# Patient Record
Sex: Female | Born: 1960 | Race: Black or African American | Hispanic: No | Marital: Single | Smoking: Former smoker
Health system: Southern US, Community
[De-identification: ages and names within clinical notes are randomized; demographics above are authoritative.]

## PROBLEM LIST (undated history)

## (undated) DIAGNOSIS — C801 Malignant (primary) neoplasm, unspecified: Secondary | ICD-10-CM

## (undated) DIAGNOSIS — I10 Essential (primary) hypertension: Secondary | ICD-10-CM

## (undated) HISTORY — PX: CHOLECYSTECTOMY: SHX55

## (undated) HISTORY — PX: BREAST SURGERY: SHX581

---

## 2016-04-20 ENCOUNTER — Emergency Department (HOSPITAL_COMMUNITY): Payer: Self-pay

## 2016-04-20 ENCOUNTER — Emergency Department (HOSPITAL_COMMUNITY)
Admission: EM | Admit: 2016-04-20 | Discharge: 2016-04-20 | Disposition: A | Discharge status: Home / Self Care | Payer: Self-pay | Attending: Emergency Medicine | Admitting: Emergency Medicine

## 2016-04-20 ENCOUNTER — Encounter (HOSPITAL_COMMUNITY): Payer: Self-pay | Admitting: *Deleted

## 2016-04-20 DIAGNOSIS — Z87891 Personal history of nicotine dependence: Secondary | ICD-10-CM | POA: Insufficient documentation

## 2016-04-20 DIAGNOSIS — R0789 Other chest pain: Secondary | ICD-10-CM | POA: Insufficient documentation

## 2016-04-20 DIAGNOSIS — I10 Essential (primary) hypertension: Secondary | ICD-10-CM | POA: Insufficient documentation

## 2016-04-20 DIAGNOSIS — R079 Chest pain, unspecified: Secondary | ICD-10-CM

## 2016-04-20 DIAGNOSIS — F419 Anxiety disorder, unspecified: Secondary | ICD-10-CM | POA: Insufficient documentation

## 2016-04-20 DIAGNOSIS — Z853 Personal history of malignant neoplasm of breast: Secondary | ICD-10-CM | POA: Insufficient documentation

## 2016-04-20 HISTORY — DX: Essential (primary) hypertension: I10

## 2016-04-20 HISTORY — DX: Malignant (primary) neoplasm, unspecified: C80.1

## 2016-04-20 LAB — BASIC METABOLIC PANEL
Anion gap: 8 (ref 5–15)
BUN: 12 mg/dL (ref 6–20)
CHLORIDE: 109 mmol/L (ref 101–111)
CO2: 22 mmol/L (ref 22–32)
CREATININE: 0.66 mg/dL (ref 0.44–1.00)
Calcium: 8.3 mg/dL — ABNORMAL LOW (ref 8.9–10.3)
GFR calc non Af Amer: >60 mL/min (ref >60)
GLUCOSE: 112 mg/dL — AB (ref 65–99)
Potassium: 3.9 mmol/L (ref 3.5–5.1)
Sodium: 139 mmol/L (ref 135–145)

## 2016-04-20 LAB — CBC
HCT: 40.5 % (ref 36.0–46.0)
Hemoglobin: 13.7 g/dL (ref 12.0–15.0)
MCH: 31.9 pg (ref 26.0–34.0)
MCHC: 33.8 g/dL (ref 30.0–36.0)
MCV: 94.2 fL (ref 78.0–100.0)
PLATELETS: 180 10*3/uL (ref 150–400)
RBC: 4.3 MIL/uL (ref 3.87–5.11)
RDW: 14.6 % (ref 11.5–15.5)
WBC: 6.9 10*3/uL (ref 4.0–10.5)

## 2016-04-20 LAB — I-STAT BETA HCG BLOOD, ED (MC, WL, AP ONLY): I-stat hCG, quantitative: <5 m[IU]/mL (ref <5)

## 2016-04-20 LAB — I-STAT TROPONIN, ED: Troponin i, poc: 0 ng/mL (ref 0.00–0.08)

## 2016-04-20 NOTE — ED Triage Notes (Signed)
PT states R chest pain and sob since last night.  States her cousin (who is checking in presently for SI), has been stressing her out.  Pain to R chest accompanied by sob.

## 2016-04-20 NOTE — ED Provider Notes (Signed)
[facility] DEPT Provider Note   CSN: [id number] Arrival date & time: 04/20/16  1136     History   Chief Complaint Chief Complaint  Patient presents with  . Chest Pain  . Anxiety    HPI Gwendolyn Griffin is a 56 y.o. female.  HPI Patient presents to the emergency department reporting increasing stress from her cousin who has mental health illness and has been noncompliant with his medications.  She brought him to the emergency department to be evaluated for his mental health instability and reported that she's been making him so anxious she's had chest tightness.  Her chest tightness been persistent since last night.  Described as sharp pain.  Her shortness of breath.  No fevers or chills.  No productive cough.  Reports she feels better at this time.  No prior history of cardiac disease.  She does have a history of hypertension.  No history of diabetes   Past Medical History:  Diagnosis Date  . Cancer ([place])    breast  . Hypertension     There are no active problems to display for this patient.   Past Surgical History:  Procedure Laterality Date  . BREAST SURGERY     R mastectomy  . CHOLECYSTECTOMY      OB History    Gravida Para Term Preterm AB Living   1             SAB TAB Ectopic Multiple Live Births                   Home Medications    Prior to Admission medications   Not on File    Family History No family history on file.  Social History Social History  Substance Use Topics  . Smoking status: Former Research scientist (life sciences)  . Smokeless tobacco: Never Used  . Alcohol use No     Allergies   Bee venom and Other   Review of Systems Review of Systems  All other systems reviewed and are negative.    Physical Exam Updated Vital Signs BP 146/88 (BP Location: Left Arm)   Pulse 101   Temp 98.4 F (36.9 C) (Oral)   Resp 18   Ht 5' (1.524 m)   Wt 235 lb (106.6 kg)   LMP 12/19/2015   SpO2 100%   BMI 45.90 kg/m   Physical Exam  Constitutional: She is  oriented to person, place, and time. She appears well-developed and well-nourished. No distress.  HENT:  Head: Normocephalic and atraumatic.  Eyes: EOM are normal.  Neck: Normal range of motion.  Cardiovascular: Normal rate, regular rhythm and normal heart sounds.   Pulmonary/Chest: Effort normal and breath sounds normal.  Abdominal: Soft. She exhibits no distension. There is no tenderness.  Musculoskeletal: Normal range of motion.  Neurological: She is alert and oriented to person, place, and time.  Skin: Skin is warm and dry.  Psychiatric: She has a normal mood and affect. Judgment normal.  Nursing note and vitals reviewed.    ED Treatments / Results  Labs (all labs ordered are listed, but only abnormal results are displayed) Labs Reviewed  CBC  BASIC METABOLIC PANEL  I-STAT [place], ED  I-STAT BETA HCG BLOOD, ED ([facility], [facility], [facility] ONLY)    EKG  EKG Interpretation  Date/Time:  Wednesday April 20 2016 11:49:17 EST Ventricular Rate:  91 PR Interval:  166 QRS Duration: 72 QT Interval:  372 QTC Calculation: 457 R Axis:   22 Text Interpretation:  Normal sinus  rhythm T wave abnormality, consider inferior ischemia Abnormal ECG No old tracing to compare Confirmed by Beniah Magnan  MD, Lennette Bihari ([id number]) on 04/20/2016 12:15:25 PM       Radiology Dg Chest 2 View  Result Date: 04/20/2016 CLINICAL DATA:  Right chest pain, shortness of breath EXAM: CHEST  2 VIEW COMPARISON:  None. FINDINGS: The heart size and mediastinal contours are within normal limits. Both lungs are clear. The visualized skeletal structures are unremarkable. IMPRESSION: No active cardiopulmonary disease. Electronically Signed   By: Kathreen Devoid   On: 04/20/2016 12:07    Procedures Procedures (including critical care time)  Medications Ordered in ED Medications - No data to display   Initial Impression / Assessment and Plan / ED Course  I have reviewed the triage vital signs and the nursing notes.  Pertinent labs &  imaging results that were available during my care of the patient were reviewed by me and considered in my medical decision making (see chart for details).     Nonspecific chest pain.  Constant since last night.  Troponin negative.  No ST changes.  No other complaints at this time.  Doubt ACS.  Doubt PE.  Discharge home in good condition.  Final Clinical Impressions(s) / ED Diagnoses   Final diagnoses:  Chest pain, unspecified type  Anxiety    New Prescriptions New Prescriptions   No medications on file     Jola Schmidt, MD 04/20/16 1228

## 2018-02-23 IMAGING — CR DG CHEST 2V
2 series · 2 of 2 positions shown · non-contrast
Comparison: None.

CLINICAL DATA: Right chest pain, shortness of breath

EXAM:
CHEST  2 VIEW

[chest pa]
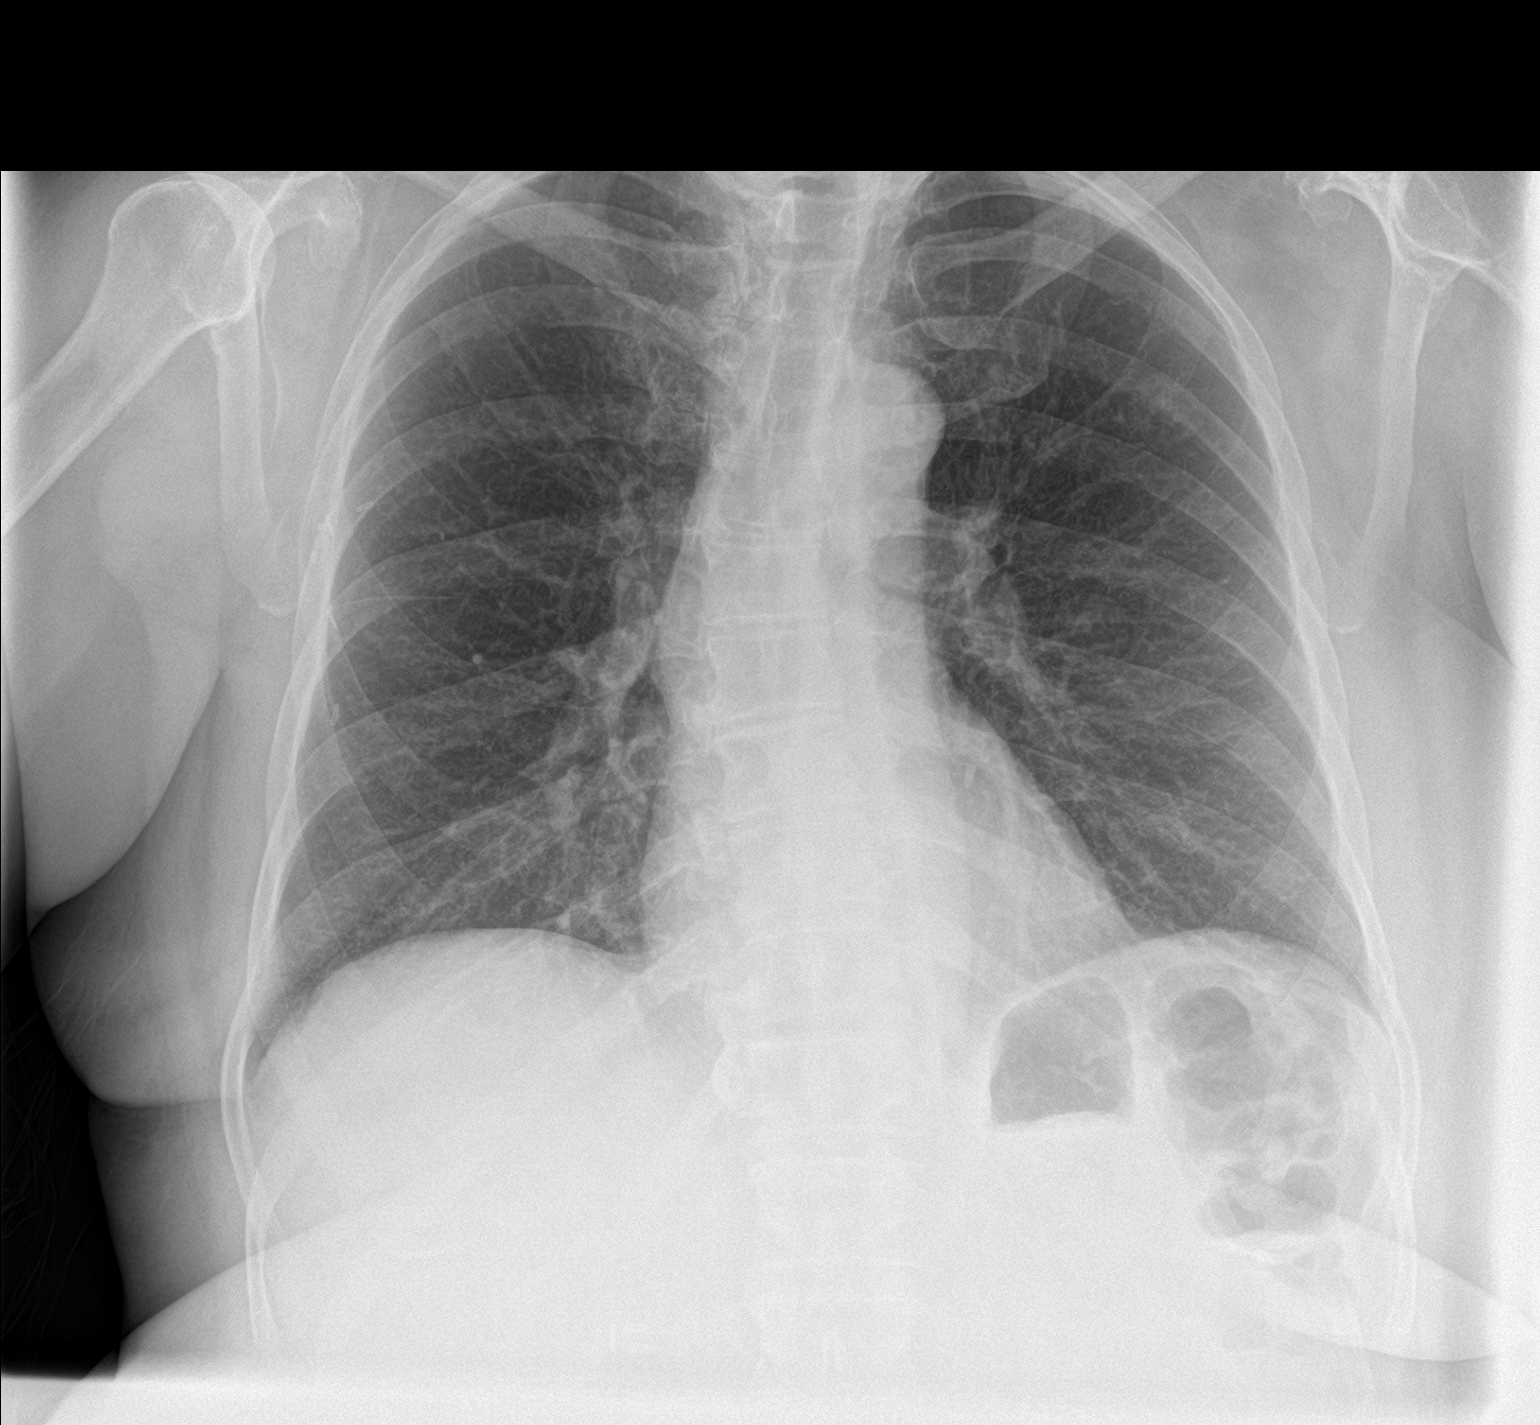

[chest lat]
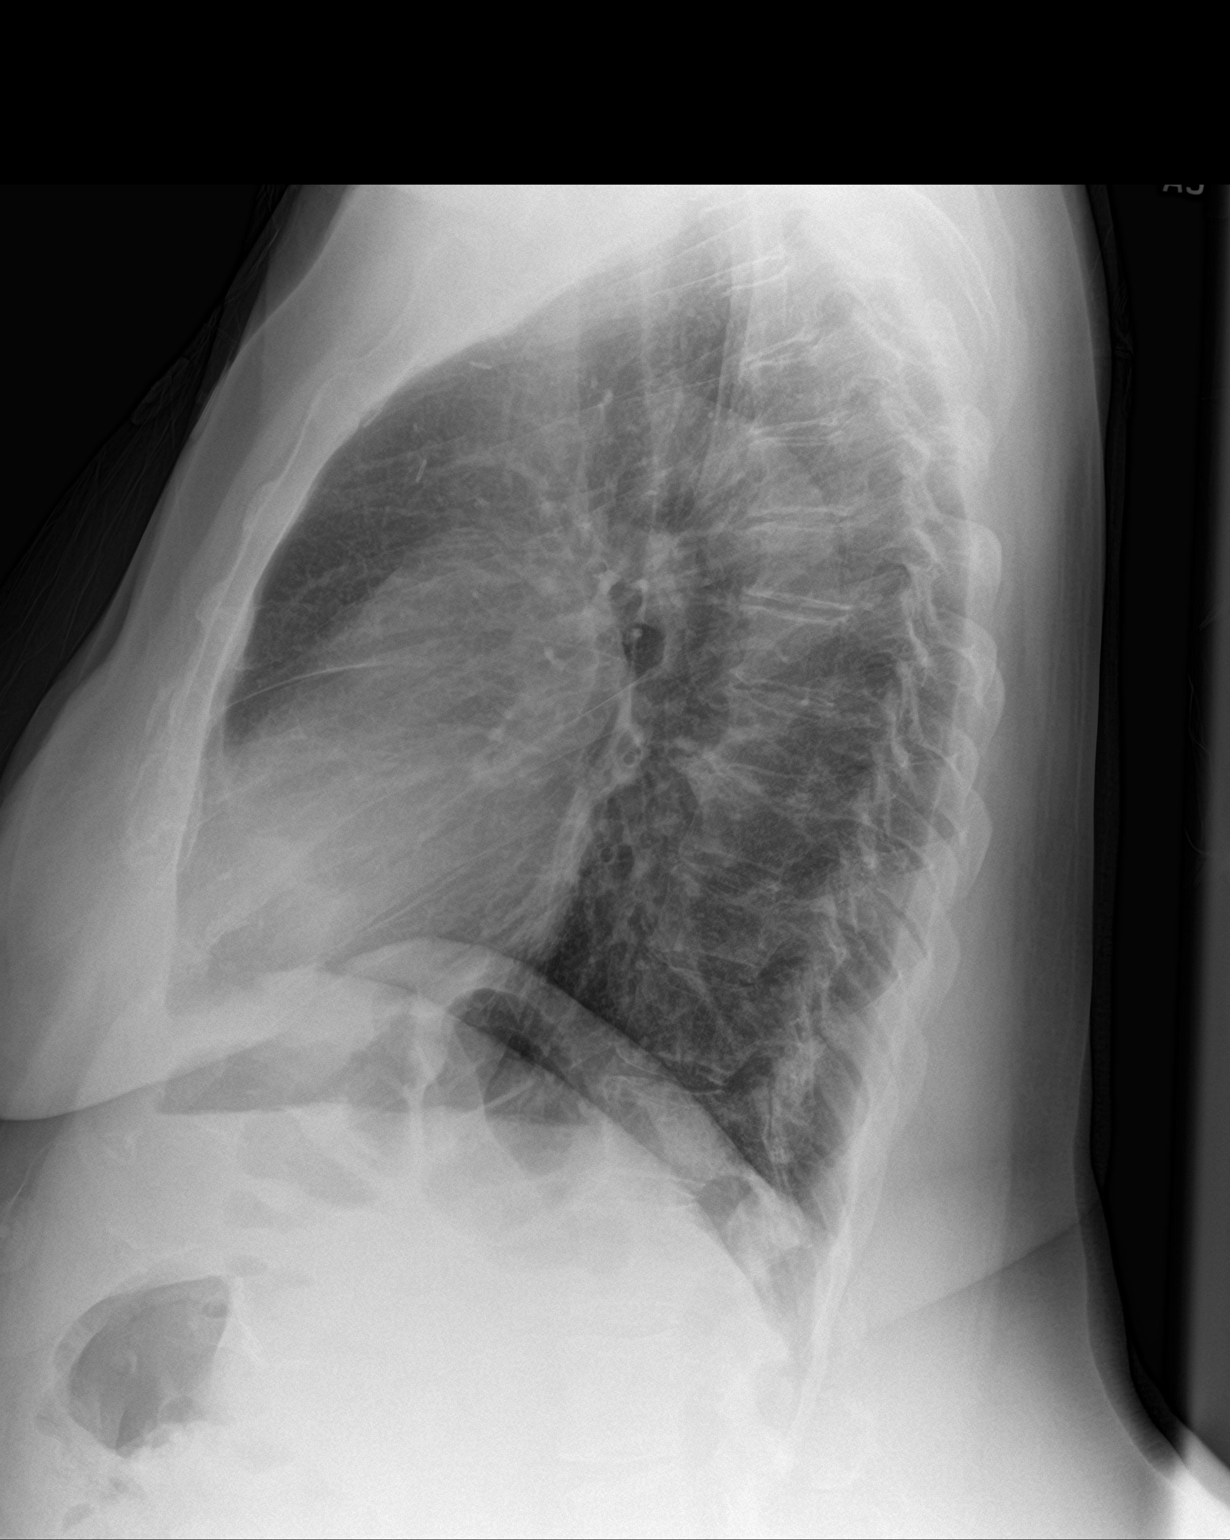

[2 of 2 positions shown; findings below may reference images not displayed]

FINDINGS: The heart size and mediastinal contours are within normal limits.
Both lungs are clear. The visualized skeletal structures are
unremarkable.
IMPRESSION: No active cardiopulmonary disease.
